# Patient Record
Sex: Male | Born: 1975 | Race: Black or African American | Hispanic: No | Marital: Single | Smoking: Never smoker
Health system: Southern US, Community
[De-identification: ages and names within clinical notes are randomized; demographics above are authoritative.]

---

## 2004-10-09 ENCOUNTER — Emergency Department (HOSPITAL_COMMUNITY): Admission: EM | Admit: 2004-10-09 | Discharge: 2004-10-10 | Payer: Self-pay | Admitting: Emergency Medicine
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
## 2021-03-29 ENCOUNTER — Encounter (HOSPITAL_COMMUNITY): Payer: Self-pay | Admitting: *Deleted

## 2021-03-29 ENCOUNTER — Other Ambulatory Visit: Payer: Self-pay

## 2021-03-29 ENCOUNTER — Ambulatory Visit (HOSPITAL_COMMUNITY)
Admission: EM | Admit: 2021-03-29 | Discharge: 2021-03-29 | Disposition: A | Discharge status: Home / Self Care | Payer: No Typology Code available for payment source | Attending: Emergency Medicine | Admitting: Emergency Medicine

## 2021-03-29 DIAGNOSIS — L02412 Cutaneous abscess of left axilla: Secondary | ICD-10-CM | POA: Diagnosis not present

## 2021-03-29 MED ORDER — DOXYCYCLINE HYCLATE 100 MG PO CAPS: 100.0000 mg | ORAL_CAPSULE | Freq: Two times a day (BID) | ORAL | 0 refills | Status: AC

## 2021-03-29 NOTE — Discharge Instructions (Signed)
Take doxycycline twice a day for the next 7 days  Hold warm-hot compresses to affected area at least 4 times a day, this helps to facilitate draining, the more the better  Please return for evaluation for increased swelling, increased tenderness or pain, non healing site, non draining site, you begin to have fever or chills   We reviewed the etiology of recurrent abscesses of skin.  Skin abscesses are collections of pus within the dermis and deeper skin tissues. Skin abscesses manifest as painful, tender, fluctuant, and erythematous nodules, frequently surmounted by a pustule and surrounded by a rim of erythematous swelling.  Spontaneous drainage of purulent material may occur.  Fever can occur on occasion.    -Skin abscesses can develop in healthy individuals with no predisposing conditions other than skin or nasal carriage of Staphylococcus aureus.  Individuals in close contact with others who have active infection with skin abscesses are at increased risk which is likely to explain why twin brother has similar episodes.   In addition, any process leading to a breach in the skin barrier can also predispose to the development of a skin abscesses, such as atopic dermatitis.

## 2021-03-29 NOTE — ED Provider Notes (Signed)
[facility]    CSN: [id number] Arrival date & time: 03/29/21  1145      History   Chief Complaint Chief Complaint  Patient presents with   Abscess    HPI Ronald Hancock is a 45 y.o. male.   Presents with abscess to the left axilla noticed 1 day ago.  Endorses that site has begun to drain.  Site is non tender.  Denies fever, chills.  Has not attempted treatment.  Abscesses have not recurred in the past.  No pertinent medical history.   History reviewed. No pertinent past medical history.  There are no problems to display for this patient.   History reviewed. No pertinent surgical history.     Home Medications    Prior to Admission medications   Medication Sig Start Date End Date Taking? Authorizing Provider  doxycycline (VIBRAMYCIN) 100 MG capsule Take 1 capsule (100 mg total) by mouth 2 (two) times daily. 03/29/21  Yes Valinda Hoar, NP    Family History History reviewed. No pertinent family history.  Social History Social History   Tobacco Use   Smoking status: Never   Smokeless tobacco: Never     Allergies   Patient has no known allergies.   Review of Systems Review of Systems  Constitutional: Negative.   Respiratory: Negative.    Cardiovascular: Negative.   Skin:  Positive for wound. Negative for color change, pallor and rash.  Neurological: Negative.     Physical Exam Triage Vital Signs ED Triage Vitals  Enc Vitals Group     BP 03/29/21 1245 126/74     Pulse Rate 03/29/21 1245 83     Resp 03/29/21 1245 18     Temp 03/29/21 1245 98.2 F (36.8 C)     Temp src --      SpO2 03/29/21 1245 100 %     Weight --      Height --      Head Circumference --      Peak Flow --      Pain Score 03/29/21 1243 0     Pain Loc --      Pain Edu? --      Excl. in GC? --    No data found.  Updated Vital Signs BP 126/74    Pulse 83    Temp 98.2 F (36.8 C)    Resp 18    SpO2 100%   Visual Acuity Right Eye Distance:   Left Eye  Distance:   Bilateral Distance:    Right Eye Near:   Left Eye Near:    Bilateral Near:     Physical Exam Constitutional:      Appearance: Normal appearance. He is normal weight.  HENT:     Head: Normocephalic.  Eyes:     Extraocular Movements: Extraocular movements intact.  Pulmonary:     Effort: Pulmonary effort is normal.  Skin:    Comments: 0.5 cm immature abscess present under the left axilla  Neurological:     Mental Status: He is alert and oriented to person, place, and time. Mental status is at baseline.  Psychiatric:        Mood and Affect: Mood normal.        Behavior: Behavior normal.     UC Treatments / Results  Labs (all labs ordered are listed, but only abnormal results are displayed) Labs Reviewed - No data to display  EKG   Radiology No results found.  Procedures Procedures (including critical  care time)  Medications Ordered in UC Medications - No data to display  Initial Impression / Assessment and Plan / UC Course  I have reviewed the triage vital signs and the nursing notes.  Pertinent labs & imaging results that were available during my care of the patient were reviewed by me and considered in my medical decision making (see chart for details).  Abscess of axilla, left  Discussed etiology of symptoms, progression and when to return to urgent care for further evaluation, prescribed doxycycline for 7-day course, recommended warm compresses to affected area at least 4 times a day to promote drainage, over-the-counter pain medications as needed, recommended exfoliation using textured washcloth and circular motions Final Clinical Impressions(s) / UC Diagnoses   Final diagnoses:  Abscess of axilla, left     Discharge Instructions      Take doxycycline twice a day for the next 7 days  Hold warm-hot compresses to affected area at least 4 times a day, this helps to facilitate draining, the more the better  Please return for evaluation for  increased swelling, increased tenderness or pain, non healing site, non draining site, you begin to have fever or chills   We reviewed the etiology of recurrent abscesses of skin.  Skin abscesses are collections of pus within the dermis and deeper skin tissues. Skin abscesses manifest as painful, tender, fluctuant, and erythematous nodules, frequently surmounted by a pustule and surrounded by a rim of erythematous swelling.  Spontaneous drainage of purulent material may occur.  Fever can occur on occasion.    -Skin abscesses can develop in healthy individuals with no predisposing conditions other than skin or nasal carriage of Staphylococcus aureus.  Individuals in close contact with others who have active infection with skin abscesses are at increased risk which is likely to explain why twin brother has similar episodes.   In addition, any process leading to a breach in the skin barrier can also predispose to the development of a skin abscesses, such as atopic dermatitis.      ED Prescriptions     Medication Sig Dispense Auth. Provider   doxycycline (VIBRAMYCIN) 100 MG capsule Take 1 capsule (100 mg total) by mouth 2 (two) times daily. 14 capsule Francisco Eyerly, Elita Boone, NP      PDMP not reviewed this encounter.   Valinda Hoar, NP 03/29/21 1257

## 2021-03-29 NOTE — ED Triage Notes (Signed)
Pt reports an abscess  located in LT axilla since yesterday.

## 2022-09-28 ENCOUNTER — Ambulatory Visit (HOSPITAL_COMMUNITY)
Admission: EM | Admit: 2022-09-28 | Discharge: 2022-09-28 | Disposition: A | Discharge status: Home / Self Care | Payer: 59 | Attending: Internal Medicine | Admitting: Internal Medicine

## 2022-09-28 ENCOUNTER — Other Ambulatory Visit: Payer: Self-pay

## 2022-09-28 ENCOUNTER — Encounter (HOSPITAL_COMMUNITY): Payer: Self-pay | Admitting: *Deleted

## 2022-09-28 DIAGNOSIS — Z20822 Contact with and (suspected) exposure to covid-19: Secondary | ICD-10-CM | POA: Diagnosis present

## 2022-09-28 LAB — SARS CORONAVIRUS 2 (TAT 6-24 HRS): SARS Coronavirus 2: NEGATIVE

## 2022-09-28 NOTE — Discharge Instructions (Signed)
We dont have rapid covid tests here, so the results could take 24 hours before we see the results

## 2022-09-28 NOTE — ED Provider Notes (Signed)
[facility]    CSN: [id number] Arrival date & time: 09/28/22  1128      History   Chief Complaint Chief Complaint  Patient presents with   Covid Exposure    HPI Adal Lyles is a 47 y.o. male who presents requesting a Covid test due to being exposed 5 days ago. He denies having any symptoms. He had a negative in home covid test yesterday, but his boss still wanted him to get tested by a clinic. Pt works delivering by himself and does not know who he got exposed to, but he was told someone at work had it.     History reviewed. No pertinent past medical history.  There are no problems to display for this patient.   History reviewed. No pertinent surgical history.     Home Medications    Prior to Admission medications   Medication Sig Start Date End Date Taking? Authorizing Provider  doxycycline (VIBRAMYCIN) 100 MG capsule Take 1 capsule (100 mg total) by mouth 2 (two) times daily. 03/29/21   Valinda Hoar, NP    Family History History reviewed. No pertinent family history.  Social History Social History   Tobacco Use   Smoking status: Never   Smokeless tobacco: Never  Vaping Use   Vaping Use: Never used  Substance Use Topics   Drug use: Never     Allergies   Patient has no known allergies.   Review of Systems Review of Systems  Endocrine: Negative.      Physical Exam Triage Vital Signs ED Triage Vitals  Enc Vitals Group     BP 09/28/22 1325 135/88     Pulse Rate 09/28/22 1325 74     Resp 09/28/22 1325 20     Temp 09/28/22 1325 97.9 F (36.6 C)     Temp Source 09/28/22 1325 Oral     SpO2 09/28/22 1325 97 %     Weight --      Height --      Head Circumference --      Peak Flow --      Pain Score 09/28/22 1327 0     Pain Loc --      Pain Edu? --      Excl. in GC? --    No data found.  Updated Vital Signs BP 135/88   Pulse 74   Temp 97.9 F (36.6 C) (Oral)   Resp 20   SpO2 97%   Visual Acuity Right Eye Distance:    Left Eye Distance:   Bilateral Distance:    Right Eye Near:   Left Eye Near:    Bilateral Near:      Physical Exam Vitals signs and nursing note reviewed.  Constitutional:      General: he is not in acute distress.    Appearance: Normal appearance. He is not ill-appearing, toxic-appearing or diaphoretic.  HENT:     Head: Normocephalic.     Right Ear: Tympanic membrane, ear canal and external ear normal.     Left Ear: Tympanic membrane, ear canal and external ear normal.     Nose: Nose normal.     Mouth/Throat: clear    Mouth: Mucous membranes are moist.  Eyes:     General: No scleral icterus.       Right eye: No discharge.        Left eye: No discharge.     Conjunctiva/sclera: Conjunctivae normal.  Neck:     Musculoskeletal: Neck supple.  No neck rigidity.  Cardiovascular:     Rate and Rhythm: Normal rate and regular rhythm.     Heart sounds: No murmur.  Pulmonary:     Effort: Pulmonary effort is normal.     Breath sounds: Normal breath sounds.    Musculoskeletal: Normal range of motion.  Lymphadenopathy:     Cervical: No cervical adenopathy.  Skin:    General: Skin is warm and dry.     Coloration: Skin is not jaundiced.     Findings: No rash.  Neurological:     Mental Status: She is alert and oriented to person, place, and time.     Gait: Gait normal.  Psychiatric:        Mood and Affect: Mood normal.        Behavior: Behavior normal.        Thought Content: Thought content normal.        Judgment: Judgment normal.    UC Treatments / Results  Labs (all labs ordered are listed, but only abnormal results are displayed) Labs Reviewed  SARS CORONAVIRUS 2 (TAT 6-24 HRS)    EKG   Radiology No results found.  Procedures Procedures (including critical care time)  Medications Ordered in UC Medications - No data to display  Initial Impression / Assessment and Plan / UC Course  I have reviewed the triage vital signs and the nursing notes.  Exposure to  covid  We will inform him of his results when back, but he has mychar and can look at them as well. I spoke with his boss for him explaining we do not have rapid covid, but clinically he is well.   Final Clinical Impressions(s) / UC Diagnoses   Final diagnoses:  Close exposure to COVID-19 virus     Discharge Instructions      We dont have rapid covid tests here, so the results could take 24 hours before we see the results      ED Prescriptions   None    PDMP not reviewed this encounter.   Garey Ham, [place] 09/28/22 1338

## 2022-09-28 NOTE — ED Triage Notes (Signed)
Pt reports Covid exposure 5 days ago; states wishes to be tested for Covid. Denies any sxs.
